# Patient Record
Sex: Female | Born: 1960 | Race: White | Hispanic: No | Marital: Married | Smoking: Never smoker
Health system: Southern US, Community
[De-identification: ages and names within clinical notes are randomized; demographics above are authoritative.]

## PROBLEM LIST (undated history)

## (undated) DIAGNOSIS — I10 Essential (primary) hypertension: Secondary | ICD-10-CM

---

## 1997-06-18 ENCOUNTER — Other Ambulatory Visit: Admission: RE | Admit: 1997-06-18 | Discharge: 1997-06-18 | Payer: Self-pay | Admitting: Obstetrics & Gynecology

## 1998-08-05 ENCOUNTER — Other Ambulatory Visit: Admission: RE | Admit: 1998-08-05 | Discharge: 1998-08-05 | Payer: Self-pay | Admitting: Obstetrics and Gynecology

## 2000-01-28 ENCOUNTER — Other Ambulatory Visit: Admission: RE | Admit: 2000-01-28 | Discharge: 2000-01-28 | Payer: Self-pay | Admitting: Obstetrics and Gynecology

## 2000-10-06 ENCOUNTER — Ambulatory Visit (HOSPITAL_COMMUNITY): Admission: RE | Admit: 2000-10-06 | Discharge: 2000-10-06 | Payer: Self-pay | Admitting: Obstetrics and Gynecology

## 2000-10-06 ENCOUNTER — Encounter: Payer: Self-pay | Admitting: Obstetrics and Gynecology

## 2001-02-14 ENCOUNTER — Other Ambulatory Visit: Admission: RE | Admit: 2001-02-14 | Discharge: 2001-02-14 | Payer: Self-pay | Admitting: Obstetrics and Gynecology

## 2007-01-25 ENCOUNTER — Other Ambulatory Visit: Admission: RE | Admit: 2007-01-25 | Discharge: 2007-01-25 | Payer: Self-pay | Admitting: Family Medicine

## 2007-11-08 ENCOUNTER — Encounter: Admission: RE | Admit: 2007-11-08 | Discharge: 2007-11-08 | Payer: Self-pay | Admitting: Family Medicine

## 2008-02-28 ENCOUNTER — Other Ambulatory Visit: Admission: RE | Admit: 2008-02-28 | Discharge: 2008-02-28 | Payer: Self-pay | Admitting: Family Medicine

## 2009-12-09 ENCOUNTER — Ambulatory Visit (HOSPITAL_COMMUNITY): Admission: RE | Admit: 2009-12-09 | Discharge: 2009-12-09 | Payer: Self-pay | Admitting: Family Medicine

## 2011-06-11 ENCOUNTER — Other Ambulatory Visit (HOSPITAL_COMMUNITY): Payer: Self-pay | Admitting: Family Medicine

## 2011-06-11 DIAGNOSIS — Z1231 Encounter for screening mammogram for malignant neoplasm of breast: Secondary | ICD-10-CM

## 2011-07-20 ENCOUNTER — Ambulatory Visit (HOSPITAL_COMMUNITY): Payer: Self-pay

## 2011-08-24 ENCOUNTER — Ambulatory Visit (HOSPITAL_COMMUNITY)
Admission: RE | Admit: 2011-08-24 | Discharge: 2011-08-24 | Disposition: A | Discharge status: Home / Self Care | Payer: Managed Care, Other (non HMO) | Source: Ambulatory Visit | Attending: Family Medicine | Admitting: Family Medicine

## 2011-08-24 DIAGNOSIS — Z1231 Encounter for screening mammogram for malignant neoplasm of breast: Secondary | ICD-10-CM | POA: Insufficient documentation

## 2011-09-25 ENCOUNTER — Encounter (HOSPITAL_BASED_OUTPATIENT_CLINIC_OR_DEPARTMENT_OTHER): Payer: Self-pay | Admitting: *Deleted

## 2011-09-25 ENCOUNTER — Emergency Department (HOSPITAL_BASED_OUTPATIENT_CLINIC_OR_DEPARTMENT_OTHER)
Admission: EM | Admit: 2011-09-25 | Discharge: 2011-09-25 | Disposition: A | Discharge status: Home / Self Care | Payer: Managed Care, Other (non HMO) | Attending: Emergency Medicine | Admitting: Emergency Medicine

## 2011-09-25 DIAGNOSIS — Y998 Other external cause status: Secondary | ICD-10-CM | POA: Insufficient documentation

## 2011-09-25 DIAGNOSIS — Y92009 Unspecified place in unspecified non-institutional (private) residence as the place of occurrence of the external cause: Secondary | ICD-10-CM | POA: Insufficient documentation

## 2011-09-25 DIAGNOSIS — T7840XA Allergy, unspecified, initial encounter: Secondary | ICD-10-CM

## 2011-09-25 DIAGNOSIS — E119 Type 2 diabetes mellitus without complications: Secondary | ICD-10-CM | POA: Insufficient documentation

## 2011-09-25 DIAGNOSIS — Y93H9 Activity, other involving exterior property and land maintenance, building and construction: Secondary | ICD-10-CM | POA: Insufficient documentation

## 2011-09-25 DIAGNOSIS — I10 Essential (primary) hypertension: Secondary | ICD-10-CM | POA: Insufficient documentation

## 2011-09-25 HISTORY — DX: Essential (primary) hypertension: I10

## 2011-09-25 MED ORDER — DIPHENHYDRAMINE HCL 25 MG PO TABS: 25.0000 mg | ORAL_TABLET | Freq: Four times a day (QID) | ORAL | Status: DC

## 2011-09-25 MED ORDER — PREDNISONE (PAK) 10 MG PO TABS: 10.0000 mg | ORAL_TABLET | Freq: Every day | ORAL | Status: AC

## 2011-09-25 MED ORDER — DIPHENHYDRAMINE HCL 25 MG PO CAPS
25.0000 mg | ORAL_CAPSULE | Freq: Once | ORAL | Status: AC
  Administered 2011-09-25: 25 mg via ORAL
  Filled 2011-09-25: qty 1

## 2011-09-25 MED ORDER — FAMOTIDINE 20 MG PO TABS
20.0000 mg | ORAL_TABLET | Freq: Once | ORAL | Status: AC
  Administered 2011-09-25: 20 mg via ORAL
  Filled 2011-09-25: qty 1

## 2011-09-25 MED ORDER — CARBAMIDE PEROXIDE 6.5 % OT SOLN: 5.0000 [drp] | OTIC | Status: AC | PRN

## 2011-09-25 MED ORDER — PREDNISONE 50 MG PO TABS
60.0000 mg | ORAL_TABLET | Freq: Once | ORAL | Status: AC
  Administered 2011-09-25: 60 mg via ORAL
  Filled 2011-09-25: qty 1

## 2011-09-25 MED ORDER — FAMOTIDINE 20 MG PO TABS: 20.0000 mg | ORAL_TABLET | Freq: Two times a day (BID) | ORAL | Status: DC

## 2011-09-25 MED ORDER — EPINEPHRINE 0.3 MG/0.3ML IJ DEVI: 0.3000 mg | Freq: Once | INTRAMUSCULAR | Status: DC

## 2011-09-25 NOTE — ED Provider Notes (Signed)
History     CSN: [id number]  Arrival date & time 09/25/11  1154   First MD Initiated Contact with Patient 09/25/11 1212      Chief Complaint  Patient presents with  . Nausea    (Consider location/radiation/quality/duration/timing/severity/associated sxs/prior treatment) HPI Comments: Patient reports she was outside this morning mowing the yard when she broke out into hives, her throat started feeling tight, she turned red in the face, had perioral numbness, and could hear her heart beating in her ears.  States she put ice packs on her face and stood in front of a fan with some improvement.  Upon arrival to ED, pt states she is feeling much better and is nearly back to normal.  States that she has a history of breaking out into hives when she is outside in the sun, mostly when she is mowing the yard but occasionally when she is at the pool.  No known allergies.  Denies recent illness, denies any chest pain, palpitations, shortness of breath, wheezing, itching.    The history is provided by the patient.    Past Medical History  Diagnosis Date  . Hypertension   . Diabetes mellitus     Past Surgical History  Procedure Date  . Cesarean section     No family history on file.  History  Substance Use Topics  . Smoking status: Never Smoker   . Smokeless tobacco: Not on file  . Alcohol Use:     OB History    Grav Para Term Preterm Abortions TAB SAB Ect Mult Living                  Review of Systems  Constitutional: Negative for appetite change.  Respiratory: Negative for cough, shortness of breath and wheezing.   Cardiovascular: Negative for chest pain and palpitations.  Skin: Positive for rash.  Neurological: Positive for numbness. Negative for weakness.    Allergies  Review of patient's allergies indicates no known allergies.  Home Medications   Current Outpatient Rx  Name Route Sig Dispense Refill  . LISINOPRIL 10 MG PO TABS Oral Take 10 mg by mouth daily.    Marland Kitchen  METFORMIN HCL 1000 MG PO TABS Oral Take 1,000 mg by mouth 2 (two) times daily with a meal.    . SIMVASTATIN 10 MG PO TABS Oral Take 10 mg by mouth at bedtime.      BP 154/81  Pulse 115  Temp 98.4 F (36.9 C) (Oral)  Resp 20  SpO2 97%  Physical Exam  Nursing note and vitals reviewed. Constitutional: She appears well-developed and well-nourished. No distress.  HENT:  Head: Normocephalic and atraumatic.  Mouth/Throat: Uvula is midline and oropharynx is clear and moist. Mucous membranes are not dry. No uvula swelling. No posterior oropharyngeal edema.       Mild swelling of bilateral eyelids.    Bilateral canals with excessive amounts of cerumen.  No impaction.    Neck: Neck supple. No tracheal tenderness present. No tracheal deviation present.  Pulmonary/Chest: Effort normal and breath sounds normal. No stridor. No respiratory distress. She has no wheezes. She has no rales. She exhibits no tenderness.  Lymphadenopathy:    She has no cervical adenopathy.  Neurological: She is alert.  Skin: No rash noted. She is not diaphoretic.    ED Course  Procedures (including critical care time)  Labs Reviewed - No data to display No results found.  12:34 PM Pt seen and examined, symptoms are currently improving.  Airway is patent.  Will treat with PO medications, observe.  Pt to be d/c home with allergist f/u.    1:34 PM Discussed patient with Dr Alto Denver.  Will send pt home with epi pen but with precautions on how to use it.  Pt reports she is almost 100% back to normal now, feeling well, comfortable going home.    1. Allergic reaction       MDM  Pt with allergic reaction to unknown agent, likely grass clippings.  Pt's symptoms mostly resolved upon arrival, continued to improve with prednisone, benadryl, and pepcid.  Pt d/c home with epi pen, three previously mentioned medications, allergist follow up.  Discussed diagnosis and follow up with patient.  Pt given return precautions.  Pt  verbalizes understanding and agrees with plan.           [place], [place] 09/25/11 1345

## 2011-09-25 NOTE — ED Notes (Signed)
Patient was mowing the yard this morning and became nauseas. Says she feels like she was having a "heat stroke", states her face felt numb and that her throat was closing. She states those symptoms have resolved.

## 2011-09-26 NOTE — ED Provider Notes (Signed)
Medical screening examination/treatment/procedure(s) were performed by non-physician practitioner and as supervising physician I was immediately available for consultation/collaboration.   Cyndra Numbers, MD 09/26/11 [phone]

## 2012-09-21 ENCOUNTER — Other Ambulatory Visit (HOSPITAL_COMMUNITY): Payer: Self-pay | Admitting: Family Medicine

## 2012-09-21 DIAGNOSIS — Z1231 Encounter for screening mammogram for malignant neoplasm of breast: Secondary | ICD-10-CM

## 2012-09-29 ENCOUNTER — Ambulatory Visit (HOSPITAL_COMMUNITY)
Admission: RE | Admit: 2012-09-29 | Discharge: 2012-09-29 | Disposition: A | Discharge status: Home / Self Care | Payer: Managed Care, Other (non HMO) | Source: Ambulatory Visit | Attending: Family Medicine | Admitting: Family Medicine

## 2012-09-29 DIAGNOSIS — Z1231 Encounter for screening mammogram for malignant neoplasm of breast: Secondary | ICD-10-CM | POA: Insufficient documentation

## 2012-10-27 ENCOUNTER — Ambulatory Visit: Payer: Managed Care, Other (non HMO) | Admitting: *Deleted

## 2013-11-29 ENCOUNTER — Other Ambulatory Visit (HOSPITAL_COMMUNITY): Payer: Self-pay | Admitting: Family Medicine

## 2013-11-29 DIAGNOSIS — Z1231 Encounter for screening mammogram for malignant neoplasm of breast: Secondary | ICD-10-CM

## 2013-11-30 ENCOUNTER — Ambulatory Visit: Payer: Managed Care, Other (non HMO) | Admitting: Dietician

## 2013-12-21 ENCOUNTER — Ambulatory Visit (HOSPITAL_COMMUNITY)
Admission: RE | Admit: 2013-12-21 | Discharge: 2013-12-21 | Disposition: A | Discharge status: Home / Self Care | Payer: BC Managed Care – PPO | Source: Ambulatory Visit | Attending: Family Medicine | Admitting: Family Medicine

## 2013-12-21 ENCOUNTER — Ambulatory Visit (HOSPITAL_COMMUNITY): Payer: Managed Care, Other (non HMO)

## 2013-12-21 DIAGNOSIS — Z1231 Encounter for screening mammogram for malignant neoplasm of breast: Secondary | ICD-10-CM | POA: Diagnosis not present

## 2014-12-18 ENCOUNTER — Other Ambulatory Visit: Payer: Self-pay

## 2014-12-18 DIAGNOSIS — Z1231 Encounter for screening mammogram for malignant neoplasm of breast: Secondary | ICD-10-CM

## 2015-01-17 ENCOUNTER — Ambulatory Visit
Admission: RE | Admit: 2015-01-17 | Discharge: 2015-01-17 | Disposition: A | Discharge status: Home / Self Care | Payer: BLUE CROSS/BLUE SHIELD | Source: Ambulatory Visit

## 2015-01-17 DIAGNOSIS — Z1231 Encounter for screening mammogram for malignant neoplasm of breast: Secondary | ICD-10-CM

## 2015-08-26 DIAGNOSIS — M5432 Sciatica, left side: Secondary | ICD-10-CM | POA: Diagnosis not present

## 2015-12-29 DIAGNOSIS — Z23 Encounter for immunization: Secondary | ICD-10-CM | POA: Diagnosis not present

## 2016-01-30 DIAGNOSIS — Z0000 Encounter for general adult medical examination without abnormal findings: Secondary | ICD-10-CM | POA: Diagnosis not present

## 2016-01-30 DIAGNOSIS — E785 Hyperlipidemia, unspecified: Secondary | ICD-10-CM | POA: Diagnosis not present

## 2016-01-30 DIAGNOSIS — I10 Essential (primary) hypertension: Secondary | ICD-10-CM | POA: Diagnosis not present

## 2016-01-30 DIAGNOSIS — R0982 Postnasal drip: Secondary | ICD-10-CM | POA: Diagnosis not present

## 2016-01-30 DIAGNOSIS — E119 Type 2 diabetes mellitus without complications: Secondary | ICD-10-CM | POA: Diagnosis not present

## 2016-10-20 ENCOUNTER — Encounter (HOSPITAL_BASED_OUTPATIENT_CLINIC_OR_DEPARTMENT_OTHER): Payer: Self-pay | Admitting: Emergency Medicine

## 2016-10-20 ENCOUNTER — Emergency Department (HOSPITAL_BASED_OUTPATIENT_CLINIC_OR_DEPARTMENT_OTHER): Payer: BLUE CROSS/BLUE SHIELD

## 2016-10-20 ENCOUNTER — Emergency Department (HOSPITAL_BASED_OUTPATIENT_CLINIC_OR_DEPARTMENT_OTHER)
Admission: EM | Admit: 2016-10-20 | Discharge: 2016-10-20 | Disposition: A | Discharge status: Home / Self Care | Payer: BLUE CROSS/BLUE SHIELD | Attending: Emergency Medicine | Admitting: Emergency Medicine

## 2016-10-20 DIAGNOSIS — M545 Low back pain, unspecified: Secondary | ICD-10-CM

## 2016-10-20 DIAGNOSIS — M5489 Other dorsalgia: Secondary | ICD-10-CM | POA: Diagnosis not present

## 2016-10-20 DIAGNOSIS — M25552 Pain in left hip: Secondary | ICD-10-CM | POA: Diagnosis not present

## 2016-10-20 DIAGNOSIS — I10 Essential (primary) hypertension: Secondary | ICD-10-CM | POA: Diagnosis not present

## 2016-10-20 DIAGNOSIS — R202 Paresthesia of skin: Secondary | ICD-10-CM | POA: Diagnosis not present

## 2016-10-20 DIAGNOSIS — E119 Type 2 diabetes mellitus without complications: Secondary | ICD-10-CM | POA: Insufficient documentation

## 2016-10-20 DIAGNOSIS — Z7984 Long term (current) use of oral hypoglycemic drugs: Secondary | ICD-10-CM | POA: Insufficient documentation

## 2016-10-20 DIAGNOSIS — I6789 Other cerebrovascular disease: Secondary | ICD-10-CM | POA: Diagnosis not present

## 2016-10-20 MED ORDER — DEXAMETHASONE SODIUM PHOSPHATE 10 MG/ML IJ SOLN
10.0000 mg | Freq: Once | INTRAMUSCULAR | Status: AC
  Administered 2016-10-20: 10 mg via INTRAVENOUS
  Filled 2016-10-20: qty 1

## 2016-10-20 MED ORDER — HYDROCODONE-ACETAMINOPHEN 5-325 MG PO TABS: 1.0000 | ORAL_TABLET | Freq: Four times a day (QID) | ORAL | 0 refills | Status: AC | PRN

## 2016-10-20 MED ORDER — MELOXICAM 15 MG PO TABS: 15.0000 mg | ORAL_TABLET | Freq: Every day | ORAL | 0 refills | Status: AC

## 2016-10-20 MED ORDER — CYCLOBENZAPRINE HCL 10 MG PO TABS
5.0000 mg | ORAL_TABLET | Freq: Two times a day (BID) | ORAL | 0 refills | Status: AC | PRN
Start: 2016-10-20 — End: ?

## 2016-10-20 MED ORDER — KETOROLAC TROMETHAMINE 30 MG/ML IJ SOLN
15.0000 mg | Freq: Once | INTRAMUSCULAR | Status: AC
  Administered 2016-10-20: 15 mg via INTRAVENOUS
  Filled 2016-10-20: qty 1

## 2016-10-20 MED FILL — MELOXICAM 15 MG TABLET: 15 | 20 days supply | Qty: 20 | Fill #0

## 2016-10-20 MED FILL — HYDROCODON-APAP 5-325: 5-325 | 2 days supply | Qty: 6 | Fill #0

## 2016-10-20 MED FILL — CYCLOBENZAPRINE 10 MG TAB: 10 | 10 days supply | Qty: 20 | Fill #0

## 2016-10-20 NOTE — ED Notes (Signed)
PIV left hand placed by EMS clotted off. New IV started for med administration

## 2016-10-20 NOTE — Discharge Instructions (Signed)
SEEK IMMEDIATE MEDICAL ATTENTION IF: New numbness, tingling, weakness, or problem with the use of your arms or legs.  Severe back pain not relieved with medications.  Change in bowel or bladder control.  Increasing pain in any areas of the body (such as chest or abdominal pain).  Shortness of breath, dizziness or fainting.  Nausea (feeling sick to your stomach), vomiting, fever, or sweats.  

## 2016-10-20 NOTE — ED Notes (Signed)
Pt able to walk to BR with assist x 1. Husband with pt in BR

## 2016-10-20 NOTE — ED Notes (Signed)
Patient in Xray

## 2016-10-20 NOTE — ED Notes (Signed)
Patient transported to X-ray 

## 2016-10-20 NOTE — ED Triage Notes (Signed)
Per ems:  Pt has "sciatica pain".  Pt having lower back pain for 3 days.  Pt had some pain in her legs yesterday.  Some numbness in right leg but relieved with position.

## 2016-10-20 NOTE — ED Provider Notes (Signed)
[facility]   CSN: [id number] Arrival date & time: 10/20/16  1016     History   Chief Complaint Chief Complaint  Patient presents with  . Back Pain    HPI Stephanie Rowland is a 56 y.o. female who presents emergency pallor chief complaint of acute onset low back pain. Patient states she's had several episodes of this in the past. She is has been seen by her primary care physician, given some pain medications than her symptoms resolve over time. She denies any injury. She complains of severe pain in her left SI joint radiating into her left gluteus and left hip. She also has some pain in the left groin. She denies any numbness, weakness, tingling, loss of bowel or bladder control, procedures to her back, rashes, fever. She is diabetic however her sugars are well-controlled.Marland Kitchen  HPI  Past Medical History:  Diagnosis Date  . Diabetes mellitus   . Hypertension     There are no active problems to display for this patient.   Past Surgical History:  Procedure Laterality Date  . CESAREAN SECTION      OB History    No data available       Home Medications    Prior to Admission medications   Medication Sig Start Date End Date Taking? Authorizing Provider  lisinopril (PRINIVIL,ZESTRIL) 10 MG tablet Take 10 mg by mouth daily.    [provider]  metFORMIN (GLUCOPHAGE) 1000 MG tablet Take 1,000 mg by mouth 2 (two) times daily with a meal.    [provider]  simvastatin (ZOCOR) 10 MG tablet Take 10 mg by mouth at bedtime.    [provider]    Family History No family history on file.  Social History Social History  Substance Use Topics  . Smoking status: Never Smoker  . Smokeless tobacco: Never Used  . Alcohol use Not on file     Allergies   Patient has no known allergies.   Review of Systems Review of Systems  Ten systems reviewed and are negative for acute change, except as noted in the HPI.   Physical  Exam Updated Vital Signs BP 122/85 (BP Location: Right Arm)   Pulse 82   Temp 98.4 F (36.9 C) (Oral)   Resp 18   Ht 5\' 4"  (1.626 m)   Wt 81.6 kg (180 lb)   SpO2 95%   BMI 30.90 kg/m   Physical Exam  Constitutional: She is oriented to person, place, and time. She appears well-developed and well-nourished. No distress.  HENT:  Head: Normocephalic and atraumatic.  Eyes: Conjunctivae are normal. No scleral icterus.  Neck: Normal range of motion.  Cardiovascular: Normal rate, regular rhythm and normal heart sounds.  Exam reveals no gallop and no friction rub.   No murmur heard. Pulmonary/Chest: Effort normal and breath sounds normal. No respiratory distress.  Abdominal: Soft. Bowel sounds are normal. She exhibits no distension and no mass. There is no tenderness. There is no guarding.  Musculoskeletal:  Patient with point tenderness over the left SI joint, tenderness to the left gluteus. No pain with flexion and extension of the hip, negative straight leg test.  Neurological: She is alert and oriented to person, place, and time.  Skin: Skin is warm and dry. She is not diaphoretic.  Psychiatric: Her behavior is normal.  Nursing note and vitals reviewed.    ED Treatments / Results  Labs (all labs ordered are listed, but only abnormal results are  displayed) Labs Reviewed - No data to display  EKG  EKG Interpretation None       Radiology No results found.  Procedures Procedures (including critical care time)  Medications Ordered in ED Medications  ketorolac (TORADOL) 30 MG/ML injection 15 mg (not administered)  dexamethasone (DECADRON) injection 10 mg (not administered)     Initial Impression / Assessment and Plan / ED Course  I have reviewed the triage vital signs and the nursing notes.  Pertinent labs & imaging results that were available during my care of the patient were reviewed by me and considered in my medical decision making (see chart for details).      Patient with back pain.  No neurological deficits and normal neuro exam.  Patient can walk but states is painful.  No loss of bowel or bladder control.  No concern for cauda equina.  No fever, night sweats, weight loss, h/o cancer, IVDU.  RICE protocol and pain medicine indicated and discussed with patient.    Final Clinical Impressions(s) / ED Diagnoses   Final diagnoses:  Acute left-sided low back pain without sciatica    New Prescriptions New Prescriptions   No medications on file     Arthor CaptainHarris, Baron Parmelee, PA-C 10/20/16 1526    Tilden Fossaees, Elizabeth, MD 10/23/16 1000

## 2016-11-04 DIAGNOSIS — I10 Essential (primary) hypertension: Secondary | ICD-10-CM | POA: Diagnosis not present

## 2016-11-04 DIAGNOSIS — M5136 Other intervertebral disc degeneration, lumbar region: Secondary | ICD-10-CM | POA: Diagnosis not present

## 2016-11-04 DIAGNOSIS — Z23 Encounter for immunization: Secondary | ICD-10-CM | POA: Diagnosis not present

## 2016-11-04 DIAGNOSIS — E785 Hyperlipidemia, unspecified: Secondary | ICD-10-CM | POA: Diagnosis not present

## 2016-11-04 DIAGNOSIS — Z1159 Encounter for screening for other viral diseases: Secondary | ICD-10-CM | POA: Diagnosis not present

## 2016-11-04 DIAGNOSIS — E119 Type 2 diabetes mellitus without complications: Secondary | ICD-10-CM | POA: Diagnosis not present

## 2017-02-02 DIAGNOSIS — R809 Proteinuria, unspecified: Secondary | ICD-10-CM | POA: Diagnosis not present

## 2017-02-02 DIAGNOSIS — I10 Essential (primary) hypertension: Secondary | ICD-10-CM | POA: Diagnosis not present

## 2017-02-02 DIAGNOSIS — E7800 Pure hypercholesterolemia, unspecified: Secondary | ICD-10-CM | POA: Diagnosis not present

## 2017-02-02 DIAGNOSIS — E1165 Type 2 diabetes mellitus with hyperglycemia: Secondary | ICD-10-CM | POA: Diagnosis not present

## 2017-03-04 DIAGNOSIS — E119 Type 2 diabetes mellitus without complications: Secondary | ICD-10-CM | POA: Diagnosis not present

## 2017-08-10 DIAGNOSIS — M7989 Other specified soft tissue disorders: Secondary | ICD-10-CM | POA: Diagnosis not present

## 2017-08-10 DIAGNOSIS — E1129 Type 2 diabetes mellitus with other diabetic kidney complication: Secondary | ICD-10-CM | POA: Diagnosis not present

## 2017-08-10 DIAGNOSIS — I10 Essential (primary) hypertension: Secondary | ICD-10-CM | POA: Diagnosis not present

## 2017-08-10 DIAGNOSIS — E785 Hyperlipidemia, unspecified: Secondary | ICD-10-CM | POA: Diagnosis not present

## 2017-08-10 DIAGNOSIS — E1165 Type 2 diabetes mellitus with hyperglycemia: Secondary | ICD-10-CM | POA: Diagnosis not present

## 2017-11-17 IMAGING — CR DG HIP (WITH OR WITHOUT PELVIS) 2-3V*L*
3 series · 3 of 3 positions shown · non-contrast
Comparison: Lumbar radiographs today reported separately.

CLINICAL DATA: 55-year-old female with lumbar back pain and left
hip pain since yesterday.

EXAM:
DG HIP (WITH OR WITHOUT PELVIS) 2-3V LEFT

[t pelvis a.p.]
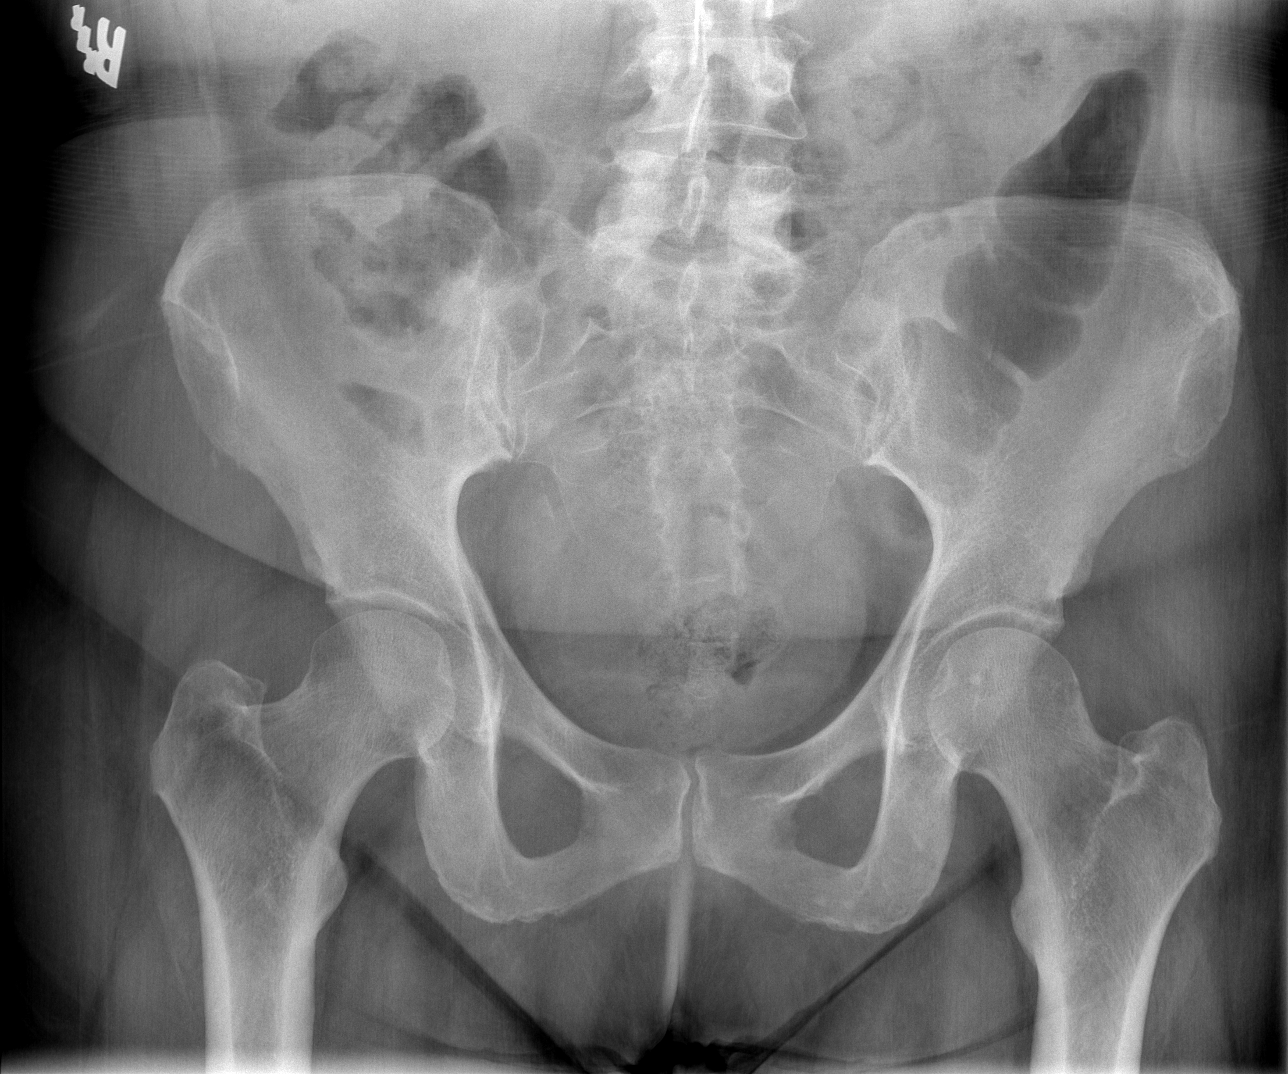

[t hip ap left]
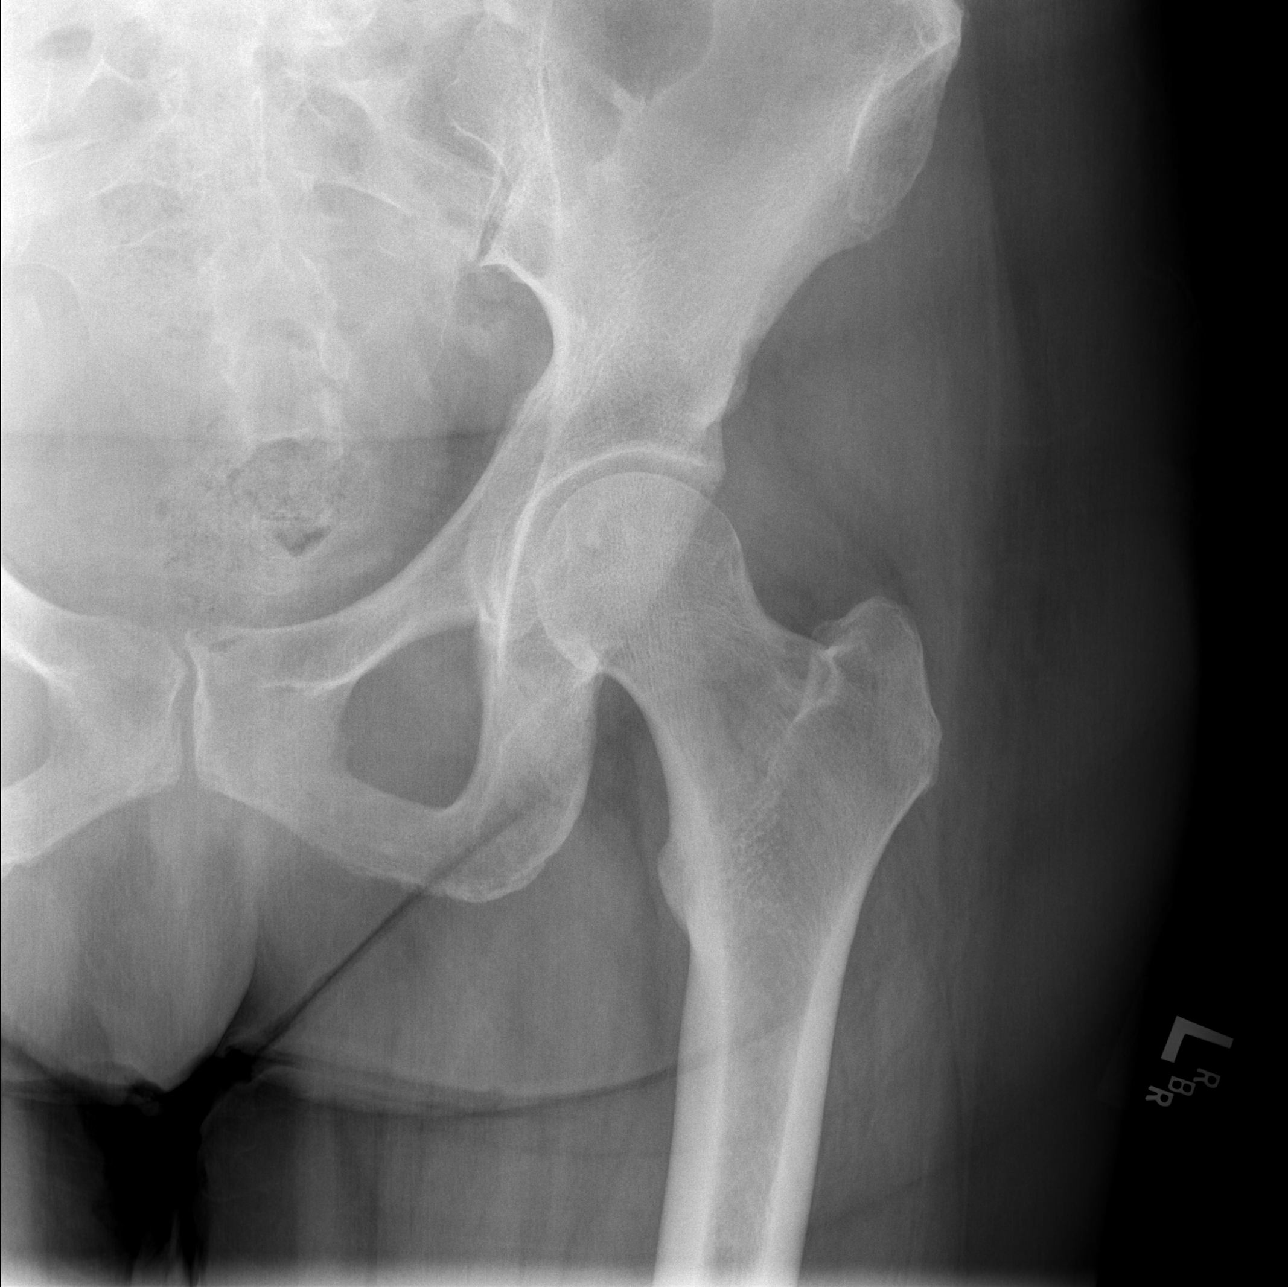

[t hip frog leg left]
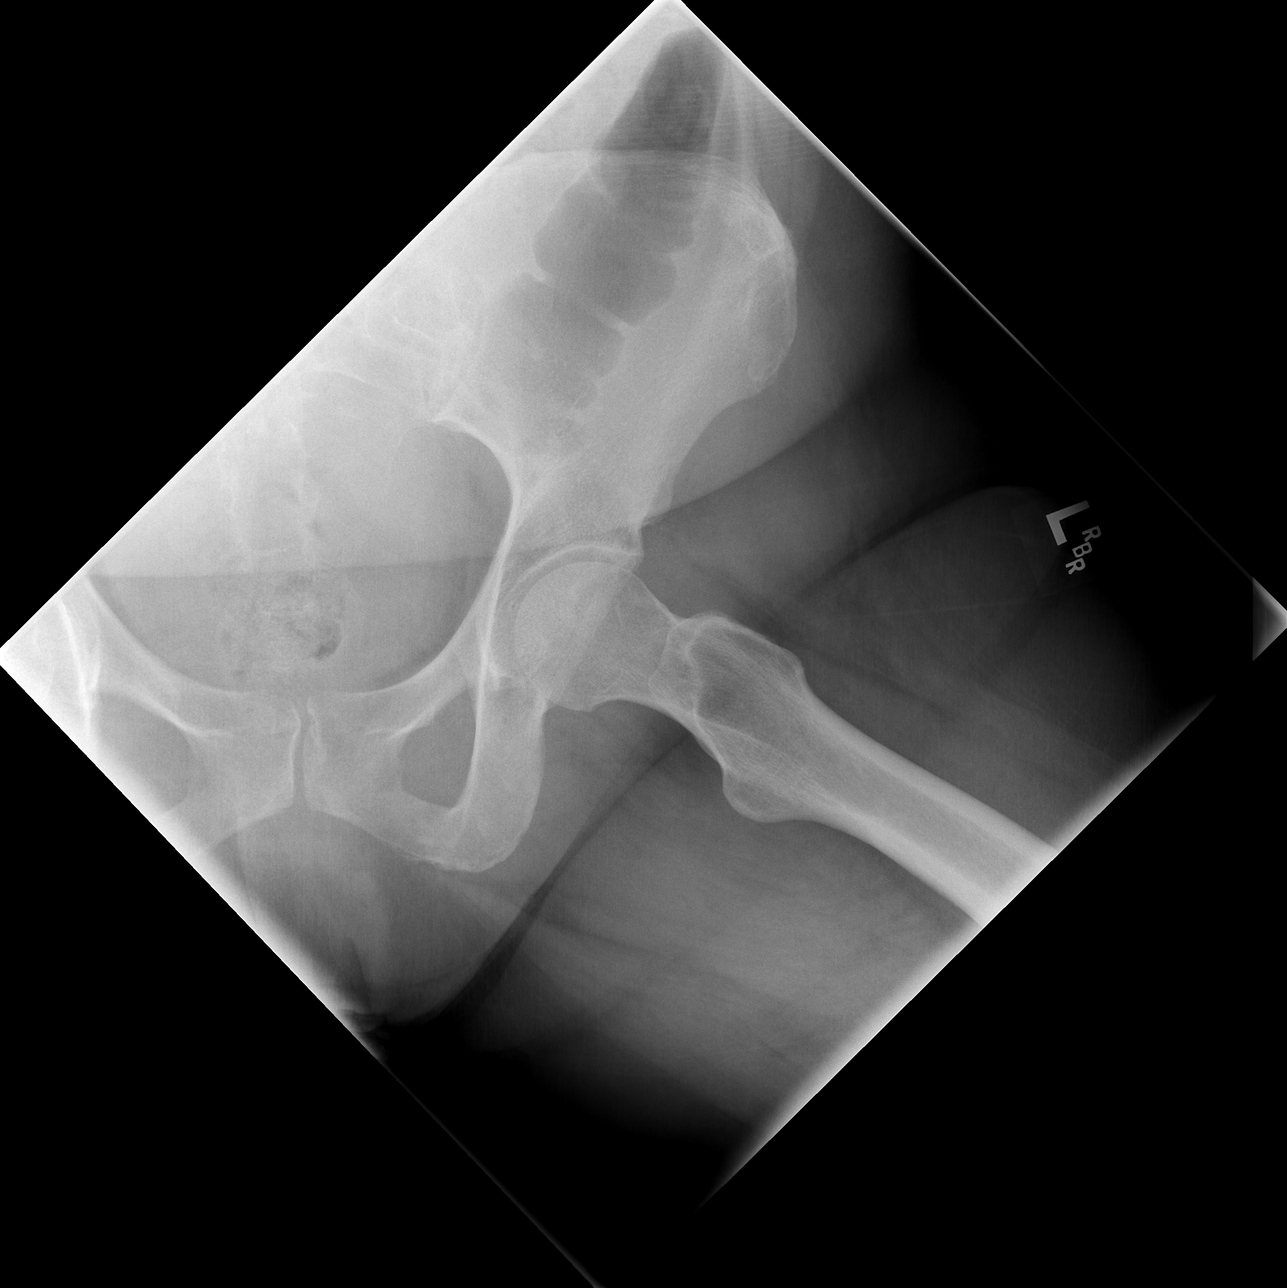

[3 of 3 positions shown; findings below may reference images not displayed]

FINDINGS: Bone mineralization is within normal limits. Femoral heads are
normally located. Hip joint spaces are preserved and symmetric.
Proximal left femur intact. Pelvis intact. SI joints appear normal.
Negative visible bowel gas pattern. No acute osseous abnormality
identified.
IMPRESSION: Negative radiographic appearance of the left hip and pelvis.

## 2017-11-24 ENCOUNTER — Other Ambulatory Visit: Payer: Self-pay | Admitting: Family Medicine

## 2017-11-24 DIAGNOSIS — Z1231 Encounter for screening mammogram for malignant neoplasm of breast: Secondary | ICD-10-CM

## 2017-12-30 ENCOUNTER — Ambulatory Visit
Admission: RE | Admit: 2017-12-30 | Discharge: 2017-12-30 | Disposition: A | Discharge status: Home / Self Care | Payer: BLUE CROSS/BLUE SHIELD | Source: Ambulatory Visit | Attending: Family Medicine | Admitting: Family Medicine

## 2017-12-30 DIAGNOSIS — Z1231 Encounter for screening mammogram for malignant neoplasm of breast: Secondary | ICD-10-CM | POA: Diagnosis not present

## 2018-03-27 DIAGNOSIS — E7800 Pure hypercholesterolemia, unspecified: Secondary | ICD-10-CM | POA: Diagnosis not present

## 2018-03-27 DIAGNOSIS — Z0001 Encounter for general adult medical examination with abnormal findings: Secondary | ICD-10-CM | POA: Diagnosis not present

## 2018-03-27 DIAGNOSIS — E1165 Type 2 diabetes mellitus with hyperglycemia: Secondary | ICD-10-CM | POA: Diagnosis not present

## 2018-03-27 DIAGNOSIS — I10 Essential (primary) hypertension: Secondary | ICD-10-CM | POA: Diagnosis not present

## 2018-12-15 DIAGNOSIS — E119 Type 2 diabetes mellitus without complications: Secondary | ICD-10-CM | POA: Diagnosis not present

## 2019-01-09 ENCOUNTER — Other Ambulatory Visit: Payer: Self-pay | Admitting: Family Medicine

## 2019-01-09 DIAGNOSIS — Z1231 Encounter for screening mammogram for malignant neoplasm of breast: Secondary | ICD-10-CM

## 2019-02-01 ENCOUNTER — Ambulatory Visit: Payer: BLUE CROSS/BLUE SHIELD

## 2019-02-22 ENCOUNTER — Ambulatory Visit: Payer: BC Managed Care – PPO

## 2019-02-22 DIAGNOSIS — R05 Cough: Secondary | ICD-10-CM | POA: Diagnosis not present

## 2019-02-22 DIAGNOSIS — J3489 Other specified disorders of nose and nasal sinuses: Secondary | ICD-10-CM | POA: Diagnosis not present

## 2019-02-22 DIAGNOSIS — R430 Anosmia: Secondary | ICD-10-CM | POA: Diagnosis not present

## 2019-02-27 DIAGNOSIS — J3489 Other specified disorders of nose and nasal sinuses: Secondary | ICD-10-CM | POA: Diagnosis not present

## 2019-02-27 DIAGNOSIS — R430 Anosmia: Secondary | ICD-10-CM | POA: Diagnosis not present

## 2019-02-27 DIAGNOSIS — R05 Cough: Secondary | ICD-10-CM | POA: Diagnosis not present

## 2019-04-11 DIAGNOSIS — Z1211 Encounter for screening for malignant neoplasm of colon: Secondary | ICD-10-CM | POA: Diagnosis not present

## 2019-04-11 DIAGNOSIS — E1165 Type 2 diabetes mellitus with hyperglycemia: Secondary | ICD-10-CM | POA: Diagnosis not present

## 2019-04-11 DIAGNOSIS — E782 Mixed hyperlipidemia: Secondary | ICD-10-CM | POA: Diagnosis not present

## 2019-04-11 DIAGNOSIS — I10 Essential (primary) hypertension: Secondary | ICD-10-CM | POA: Diagnosis not present

## 2019-05-11 DIAGNOSIS — I10 Essential (primary) hypertension: Secondary | ICD-10-CM | POA: Diagnosis not present

## 2019-05-11 DIAGNOSIS — E782 Mixed hyperlipidemia: Secondary | ICD-10-CM | POA: Diagnosis not present

## 2019-05-11 DIAGNOSIS — E1165 Type 2 diabetes mellitus with hyperglycemia: Secondary | ICD-10-CM | POA: Diagnosis not present

## 2019-12-17 DIAGNOSIS — I10 Essential (primary) hypertension: Secondary | ICD-10-CM | POA: Diagnosis not present

## 2019-12-17 DIAGNOSIS — E7800 Pure hypercholesterolemia, unspecified: Secondary | ICD-10-CM | POA: Diagnosis not present

## 2019-12-17 DIAGNOSIS — E1129 Type 2 diabetes mellitus with other diabetic kidney complication: Secondary | ICD-10-CM | POA: Diagnosis not present

## 2019-12-17 DIAGNOSIS — M7710 Lateral epicondylitis, unspecified elbow: Secondary | ICD-10-CM | POA: Diagnosis not present

## 2020-04-15 DIAGNOSIS — E109 Type 1 diabetes mellitus without complications: Secondary | ICD-10-CM | POA: Diagnosis not present

## 2020-05-16 DIAGNOSIS — E109 Type 1 diabetes mellitus without complications: Secondary | ICD-10-CM | POA: Diagnosis not present

## 2020-06-15 DIAGNOSIS — E109 Type 1 diabetes mellitus without complications: Secondary | ICD-10-CM | POA: Diagnosis not present

## 2020-07-16 DIAGNOSIS — E1129 Type 2 diabetes mellitus with other diabetic kidney complication: Secondary | ICD-10-CM | POA: Diagnosis not present

## 2020-07-16 DIAGNOSIS — E109 Type 1 diabetes mellitus without complications: Secondary | ICD-10-CM | POA: Diagnosis not present

## 2020-07-16 DIAGNOSIS — Z0000 Encounter for general adult medical examination without abnormal findings: Secondary | ICD-10-CM | POA: Diagnosis not present

## 2020-07-16 DIAGNOSIS — D229 Melanocytic nevi, unspecified: Secondary | ICD-10-CM | POA: Diagnosis not present

## 2020-07-16 DIAGNOSIS — E7800 Pure hypercholesterolemia, unspecified: Secondary | ICD-10-CM | POA: Diagnosis not present

## 2020-07-16 DIAGNOSIS — I10 Essential (primary) hypertension: Secondary | ICD-10-CM | POA: Diagnosis not present

## 2020-07-28 DIAGNOSIS — L720 Epidermal cyst: Secondary | ICD-10-CM | POA: Diagnosis not present

## 2020-07-28 DIAGNOSIS — L821 Other seborrheic keratosis: Secondary | ICD-10-CM | POA: Diagnosis not present

## 2020-07-28 DIAGNOSIS — D485 Neoplasm of uncertain behavior of skin: Secondary | ICD-10-CM | POA: Diagnosis not present

## 2020-07-28 DIAGNOSIS — D225 Melanocytic nevi of trunk: Secondary | ICD-10-CM | POA: Diagnosis not present

## 2020-07-28 DIAGNOSIS — D1801 Hemangioma of skin and subcutaneous tissue: Secondary | ICD-10-CM | POA: Diagnosis not present

## 2020-08-15 DIAGNOSIS — E109 Type 1 diabetes mellitus without complications: Secondary | ICD-10-CM | POA: Diagnosis not present

## 2020-09-15 DIAGNOSIS — E109 Type 1 diabetes mellitus without complications: Secondary | ICD-10-CM | POA: Diagnosis not present

## 2020-12-01 ENCOUNTER — Other Ambulatory Visit: Payer: Self-pay | Admitting: Family Medicine

## 2020-12-01 DIAGNOSIS — Z1231 Encounter for screening mammogram for malignant neoplasm of breast: Secondary | ICD-10-CM

## 2020-12-16 DIAGNOSIS — E109 Type 1 diabetes mellitus without complications: Secondary | ICD-10-CM | POA: Diagnosis not present

## 2021-01-01 ENCOUNTER — Ambulatory Visit: Payer: Self-pay

## 2021-01-12 DIAGNOSIS — K5730 Diverticulosis of large intestine without perforation or abscess without bleeding: Secondary | ICD-10-CM | POA: Diagnosis not present

## 2021-01-12 DIAGNOSIS — Z1211 Encounter for screening for malignant neoplasm of colon: Secondary | ICD-10-CM | POA: Diagnosis not present

## 2021-01-12 DIAGNOSIS — D120 Benign neoplasm of cecum: Secondary | ICD-10-CM | POA: Diagnosis not present

## 2021-01-15 DIAGNOSIS — E109 Type 1 diabetes mellitus without complications: Secondary | ICD-10-CM | POA: Diagnosis not present

## 2021-01-28 DIAGNOSIS — I10 Essential (primary) hypertension: Secondary | ICD-10-CM | POA: Diagnosis not present

## 2021-01-28 DIAGNOSIS — E7800 Pure hypercholesterolemia, unspecified: Secondary | ICD-10-CM | POA: Diagnosis not present

## 2021-01-28 DIAGNOSIS — E119 Type 2 diabetes mellitus without complications: Secondary | ICD-10-CM | POA: Diagnosis not present

## 2021-02-15 DIAGNOSIS — E109 Type 1 diabetes mellitus without complications: Secondary | ICD-10-CM | POA: Diagnosis not present

## 2021-02-26 ENCOUNTER — Ambulatory Visit: Payer: Self-pay

## 2021-03-18 DIAGNOSIS — E109 Type 1 diabetes mellitus without complications: Secondary | ICD-10-CM | POA: Diagnosis not present

## 2021-04-15 DIAGNOSIS — E109 Type 1 diabetes mellitus without complications: Secondary | ICD-10-CM | POA: Diagnosis not present

## 2021-05-16 DIAGNOSIS — E109 Type 1 diabetes mellitus without complications: Secondary | ICD-10-CM | POA: Diagnosis not present

## 2021-06-15 DIAGNOSIS — E109 Type 1 diabetes mellitus without complications: Secondary | ICD-10-CM | POA: Diagnosis not present

## 2021-07-16 DIAGNOSIS — E109 Type 1 diabetes mellitus without complications: Secondary | ICD-10-CM | POA: Diagnosis not present

## 2021-07-28 ENCOUNTER — Other Ambulatory Visit: Payer: Self-pay | Admitting: Nurse Practitioner

## 2021-07-28 DIAGNOSIS — Z1231 Encounter for screening mammogram for malignant neoplasm of breast: Secondary | ICD-10-CM

## 2021-07-28 NOTE — Progress Notes (Signed)
Patient interested in screening mammogram via mobile mammogram at [facility]. Last mammogram in 2019. Order placed.

## 2021-08-11 ENCOUNTER — Ambulatory Visit
Admission: RE | Admit: 2021-08-11 | Discharge: 2021-08-11 | Disposition: A | Discharge status: Home / Self Care | Payer: BC Managed Care – PPO | Source: Ambulatory Visit | Attending: Nurse Practitioner | Admitting: Nurse Practitioner

## 2021-08-11 DIAGNOSIS — Z1231 Encounter for screening mammogram for malignant neoplasm of breast: Secondary | ICD-10-CM

## 2021-08-15 DIAGNOSIS — E109 Type 1 diabetes mellitus without complications: Secondary | ICD-10-CM | POA: Diagnosis not present

## 2021-09-15 DIAGNOSIS — E109 Type 1 diabetes mellitus without complications: Secondary | ICD-10-CM | POA: Diagnosis not present

## 2021-10-16 DIAGNOSIS — E109 Type 1 diabetes mellitus without complications: Secondary | ICD-10-CM | POA: Diagnosis not present

## 2021-11-15 DIAGNOSIS — E109 Type 1 diabetes mellitus without complications: Secondary | ICD-10-CM | POA: Diagnosis not present

## 2021-12-16 DIAGNOSIS — E109 Type 1 diabetes mellitus without complications: Secondary | ICD-10-CM | POA: Diagnosis not present

## 2021-12-25 DIAGNOSIS — E1169 Type 2 diabetes mellitus with other specified complication: Secondary | ICD-10-CM | POA: Diagnosis not present

## 2021-12-25 DIAGNOSIS — E119 Type 2 diabetes mellitus without complications: Secondary | ICD-10-CM | POA: Diagnosis not present

## 2021-12-25 DIAGNOSIS — E669 Obesity, unspecified: Secondary | ICD-10-CM | POA: Diagnosis not present

## 2021-12-25 DIAGNOSIS — E785 Hyperlipidemia, unspecified: Secondary | ICD-10-CM | POA: Diagnosis not present

## 2021-12-25 DIAGNOSIS — I10 Essential (primary) hypertension: Secondary | ICD-10-CM | POA: Diagnosis not present

## 2021-12-25 DIAGNOSIS — Z0000 Encounter for general adult medical examination without abnormal findings: Secondary | ICD-10-CM | POA: Diagnosis not present

## 2022-01-15 DIAGNOSIS — H6121 Impacted cerumen, right ear: Secondary | ICD-10-CM | POA: Diagnosis not present

## 2022-04-30 DIAGNOSIS — K219 Gastro-esophageal reflux disease without esophagitis: Secondary | ICD-10-CM | POA: Diagnosis not present

## 2022-04-30 DIAGNOSIS — R079 Chest pain, unspecified: Secondary | ICD-10-CM | POA: Diagnosis not present

## 2022-05-25 ENCOUNTER — Other Ambulatory Visit: Payer: Self-pay | Admitting: Nurse Practitioner

## 2022-05-25 DIAGNOSIS — Z1231 Encounter for screening mammogram for malignant neoplasm of breast: Secondary | ICD-10-CM

## 2022-05-25 NOTE — Progress Notes (Signed)
Patient is interested in obtaining screening mammogram via mobile mammogram bus at [facility]. No concerns at this time. Last mammogram: 08/11/21  Order placed.  

## 2022-05-28 DIAGNOSIS — K219 Gastro-esophageal reflux disease without esophagitis: Secondary | ICD-10-CM | POA: Diagnosis not present

## 2022-08-18 ENCOUNTER — Ambulatory Visit
Admission: RE | Admit: 2022-08-18 | Discharge: 2022-08-18 | Disposition: A | Discharge status: Home / Self Care | Payer: BC Managed Care – PPO | Source: Ambulatory Visit | Attending: Nurse Practitioner | Admitting: Nurse Practitioner

## 2022-08-18 ENCOUNTER — Other Ambulatory Visit: Payer: Self-pay | Admitting: Family Medicine

## 2022-08-18 DIAGNOSIS — Z1231 Encounter for screening mammogram for malignant neoplasm of breast: Secondary | ICD-10-CM | POA: Diagnosis not present

## 2023-01-05 DIAGNOSIS — Z23 Encounter for immunization: Secondary | ICD-10-CM | POA: Diagnosis not present

## 2023-01-05 DIAGNOSIS — I10 Essential (primary) hypertension: Secondary | ICD-10-CM | POA: Diagnosis not present

## 2023-01-05 DIAGNOSIS — E785 Hyperlipidemia, unspecified: Secondary | ICD-10-CM | POA: Diagnosis not present

## 2023-01-05 DIAGNOSIS — E1129 Type 2 diabetes mellitus with other diabetic kidney complication: Secondary | ICD-10-CM | POA: Diagnosis not present

## 2023-01-05 DIAGNOSIS — Z0000 Encounter for general adult medical examination without abnormal findings: Secondary | ICD-10-CM | POA: Diagnosis not present

## 2023-01-05 DIAGNOSIS — E119 Type 2 diabetes mellitus without complications: Secondary | ICD-10-CM | POA: Diagnosis not present

## 2023-06-22 DIAGNOSIS — I10 Essential (primary) hypertension: Secondary | ICD-10-CM | POA: Diagnosis not present

## 2023-06-22 DIAGNOSIS — Z0183 Encounter for blood typing: Secondary | ICD-10-CM | POA: Diagnosis not present

## 2023-06-22 DIAGNOSIS — E785 Hyperlipidemia, unspecified: Secondary | ICD-10-CM | POA: Diagnosis not present

## 2023-06-22 DIAGNOSIS — E1129 Type 2 diabetes mellitus with other diabetic kidney complication: Secondary | ICD-10-CM | POA: Diagnosis not present

## 2023-07-12 ENCOUNTER — Other Ambulatory Visit: Payer: Self-pay | Admitting: Family Medicine

## 2023-07-12 DIAGNOSIS — Z1231 Encounter for screening mammogram for malignant neoplasm of breast: Secondary | ICD-10-CM

## 2023-09-07 ENCOUNTER — Ambulatory Visit
Admission: RE | Admit: 2023-09-07 | Discharge: 2023-09-07 | Disposition: A | Discharge status: Home / Self Care | Source: Ambulatory Visit | Attending: Family Medicine | Admitting: Family Medicine

## 2023-09-07 DIAGNOSIS — Z1231 Encounter for screening mammogram for malignant neoplasm of breast: Secondary | ICD-10-CM

## 2023-11-16 DIAGNOSIS — E109 Type 1 diabetes mellitus without complications: Secondary | ICD-10-CM | POA: Diagnosis not present

## 2023-12-16 DIAGNOSIS — H0100B Unspecified blepharitis left eye, upper and lower eyelids: Secondary | ICD-10-CM | POA: Diagnosis not present

## 2023-12-17 DIAGNOSIS — E109 Type 1 diabetes mellitus without complications: Secondary | ICD-10-CM | POA: Diagnosis not present

## 2023-12-28 DIAGNOSIS — Z23 Encounter for immunization: Secondary | ICD-10-CM | POA: Diagnosis not present

## 2023-12-28 DIAGNOSIS — E669 Obesity, unspecified: Secondary | ICD-10-CM | POA: Diagnosis not present

## 2023-12-28 DIAGNOSIS — E119 Type 2 diabetes mellitus without complications: Secondary | ICD-10-CM | POA: Diagnosis not present

## 2023-12-28 DIAGNOSIS — E782 Mixed hyperlipidemia: Secondary | ICD-10-CM | POA: Diagnosis not present

## 2023-12-28 DIAGNOSIS — I10 Essential (primary) hypertension: Secondary | ICD-10-CM | POA: Diagnosis not present

## 2023-12-28 DIAGNOSIS — Z79899 Other long term (current) drug therapy: Secondary | ICD-10-CM | POA: Diagnosis not present

## 2023-12-28 DIAGNOSIS — Z0000 Encounter for general adult medical examination without abnormal findings: Secondary | ICD-10-CM | POA: Diagnosis not present
